# Patient Record
Sex: Female | Born: 2000 | Race: White | Hispanic: No | Marital: Single | Smoking: Never smoker
Health system: Southern US, Community
[De-identification: ages and names within clinical notes are randomized; demographics above are authoritative.]

## PROBLEM LIST (undated history)

## (undated) HISTORY — PX: TONSILLECTOMY: SUR1361

---
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
## 2018-03-30 NOTE — Progress Notes (Deleted)
      HPI: FU dizziness. At time of previous evaluation symptoms felt to be orthostatic mediated. Echocardiogram July 2019 showed normal LV function.  Since last seen  Current Outpatient Medications  Medication Sig Dispense Refill  . Multiple Vitamin (MULTIVITAMIN) capsule Take 1 capsule by mouth daily.     No current facility-administered medications for this visit.      No past medical history on file.  Past Surgical History:  Procedure Laterality Date  . TONSILLECTOMY      Social History   Socioeconomic History  . Marital status: Single    Spouse name: Not on file  . Number of children: Not on file  . Years of education: Not on file  . Highest education level: Not on file  Occupational History  . Not on file  Social Needs  . Financial resource strain: Not on file  . Food insecurity:    Worry: Not on file    Inability: Not on file  . Transportation needs:    Medical: Not on file    Non-medical: Not on file  Tobacco Use  . Smoking status: Never Smoker  . Smokeless tobacco: Never Used  Substance and Sexual Activity  . Alcohol use: Never    Frequency: Never  . Drug use: Never  . Sexual activity: Not on file  Lifestyle  . Physical activity:    Days per week: Not on file    Minutes per session: Not on file  . Stress: Not on file  Relationships  . Social connections:    Talks on phone: Not on file    Gets together: Not on file    Attends religious service: Not on file    Active member of club or organization: Not on file    Attends meetings of clubs or organizations: Not on file    Relationship status: Not on file  . Intimate partner violence:    Fear of current or ex partner: Not on file    Emotionally abused: Not on file    Physically abused: Not on file    Forced sexual activity: Not on file  Other Topics Concern  . Not on file  Social History Narrative  . Not on file    Family History  Problem Relation Age of Onset  . Migraines Mother      ROS: no fevers or chills, productive cough, hemoptysis, dysphasia, odynophagia, melena, hematochezia, dysuria, hematuria, rash, seizure activity, orthopnea, PND, pedal edema, claudication. Remaining systems are negative.  Physical Exam: Well-developed well-nourished in no acute distress.  Skin is warm and dry.  HEENT is normal.  Neck is supple.  Chest is clear to auscultation with normal expansion.  Cardiovascular exam is regular rate and rhythm.  Abdominal exam nontender or distended. No masses palpated. Extremities show no edema. neuro grossly intact  ECG- personally reviewed  A/P  1  Marissa MillersBrian Crenshaw, MD

## 2018-04-05 ENCOUNTER — Other Ambulatory Visit: Payer: Self-pay

## 2018-04-05 ENCOUNTER — Ambulatory Visit (INDEPENDENT_AMBULATORY_CARE_PROVIDER_SITE_OTHER): Payer: BC Managed Care – PPO | Admitting: Pediatrics

## 2018-04-05 VITALS — BP 102/69 | HR 57 | Ht 68.23 in | Wt 130.4 lb

## 2018-04-05 DIAGNOSIS — L659 Nonscarring hair loss, unspecified: Secondary | ICD-10-CM | POA: Diagnosis not present

## 2018-04-05 DIAGNOSIS — Z1389 Encounter for screening for other disorder: Secondary | ICD-10-CM

## 2018-04-05 DIAGNOSIS — Z3202 Encounter for pregnancy test, result negative: Secondary | ICD-10-CM

## 2018-04-05 DIAGNOSIS — N911 Secondary amenorrhea: Secondary | ICD-10-CM | POA: Diagnosis not present

## 2018-04-05 DIAGNOSIS — F5089 Other specified eating disorder: Secondary | ICD-10-CM | POA: Diagnosis not present

## 2018-04-05 DIAGNOSIS — K5901 Slow transit constipation: Secondary | ICD-10-CM

## 2018-04-05 LAB — POCT URINALYSIS DIPSTICK
Bilirubin, UA: NEGATIVE
Blood, UA: NEGATIVE
GLUCOSE UA: NEGATIVE
KETONES UA: NEGATIVE
Leukocytes, UA: NEGATIVE
Nitrite, UA: NEGATIVE
PROTEIN UA: NEGATIVE
Spec Grav, UA: <=1.005 — AB (ref 1.010–1.025)
Urobilinogen, UA: NEGATIVE E.U./dL — AB
pH, UA: 7 (ref 5.0–8.0)

## 2018-04-05 LAB — POCT URINE PREGNANCY: Preg Test, Ur: NEGATIVE

## 2018-04-05 MED ORDER — POLYETHYLENE GLYCOL 3350 17 GM/SCOOP PO POWD: 17.0000 g | Freq: Every day | ORAL | 6 refills | Status: AC

## 2018-04-05 NOTE — Progress Notes (Signed)
THIS RECORD MAY CONTAIN CONFIDENTIAL INFORMATION THAT SHOULD NOT BE RELEASED WITHOUT REVIEW OF THE SERVICE PROVIDER.  Adolescent Medicine Consultation Follow-Up Visit CHRISTY EHRSAM  is a 17 y.o. female referred by Blair Heys, MD here today for follow-up regarding disordered eating.   Last seen in [facility] on 11/16/2017 for disordered eating. Plan at last visit included: recommended resources at [facility]. Pt preferred to not start meds, but to continue nutrition and counseling.   Pertinent Labs? Yes Growth Chart Viewed? yes   History was provided by the patient and mother.  Interpreter? no  PCP Confirmed?  yes  My Chart Activated?   yes   Chief Complaint  Patient presents with  . Follow-up    DE w/o EVS    HPI:   Saw a nutritionist 5 times, thought that was helpful and that for awhile she was feeling better about food. Then at some point "she fell off of that good feeling" and started thinking more about food again. Felt like the quantity of food that she was eating was too much for her stomach; eating based on what the nutritionist recommended. Stools were more regular then, now harder.  Doesn't think her current thoughts surrounding food have changed much since last visit. Is trying to follow what the nurittionist says, but constantly thinks about food. Thinks about whether she should or shouldn't eat something and had frequent guilt over "bad" choices. Thoughts are worse at night when she has more time to think about what she has eaten during the day. Also scared because she was told to drink shakes specifically for weight gain. Wants to try eating disorder specific therapy before medications. Feels like she would be more stressed about medications if taking.   Mom also thinks pt still worries about food a lot. Will call and tell mom about treats that she had during the day and feeling guilty. Told mom about feeling bloated occasionally and mom wants her  stomach to feel better.  Had new abdominal pain in November, surrounding period of increased stress. Felt like abdominal pain continued on and off for the rest of the semester. 4x/week, starts around dinner and lasts until she goes to bed. Feels "knot-like" pain in central abdomen. Makes her not want to eat for fear of pain getting worse. Isn't sure if pain actually increases with food. +heartburn, occasional nausea. Tums and gas-x tried a few times, but didn't work.  Hard stools once/day. No vomiting or blood in stools. No laxatives tried. No known association of certain foods to abdominal pain.  Went to counselor at [facility] 5-6times, was kind of helpful, but not specific to eating disorders. Plans to get someone new who is more focused on disordered eating when she returns to school for the sprin semester on 06JAN.  Stressful semester academically, but did well. 17hrs classes (physics, calc, honors, Public relations account executive). Not depressed, not overly anxious (just worries about food). Mom thinks pt is doing really well and is happy overall at college. Will call mom frequently. Is close to her roommate too.   LMP-no periods since June.  No LMP recorded. (Menstrual status: Irregular Periods). Allergies  Allergen Reactions  . Penicillins    Outpatient Medications Prior to Visit  Medication Sig Dispense Refill  . Multiple Vitamin (MULTIVITAMIN) capsule Take 1 capsule by mouth daily.     No facility-administered medications prior to visit.      Patient Active Problem List   Diagnosis Date Noted  . Disordered eating  10/19/2017  . Dizziness 10/19/2017  . Other fatigue 09/06/2017  . Impingement syndrome of right ankle 02/28/2013    Social History: Changes with school since last visit?  Yes- completed first semester at [employer]  Activities:  Special interests/hobbies/sports: Corporate investment banker at school, workout classes 2x/week, no dancing (plans to dance next semester)  Lifestyle habits that can  impact QOL: Sleep:7-8hrs Eating habits/patterns: 3 meals Breakfast- oatmeal or yogurt, fruit; sometimes breakfast essentials shake Lunch- wrap Dinner-soups, veggies/fish Snack - trail mix, fruit, granola bar Decreased appetite for dinner in the last month.  Water intake: 32oz x 3day Screen time: uses computer all day Sexually active - no, interested in males  Review of Systems  Constitutional: Negative for chills, fever, malaise/fatigue and weight loss.  HENT: Negative for sore throat.   Eyes: Negative for blurred vision, double vision and pain.  Respiratory: Negative for cough, shortness of breath and wheezing.   Cardiovascular: Negative for chest pain and palpitations.  Gastrointestinal: Positive for abdominal pain, constipation, heartburn and nausea. Negative for blood in stool, diarrhea and vomiting.  Genitourinary: Negative for dysuria, frequency and urgency.       No vaginal rashes, lesions, or discharge.  Musculoskeletal: Negative for joint pain and myalgias.  Skin: Negative for rash.       Hair has been thinner over the last 2 months  Neurological: Positive for dizziness (rare, relieved if she eats something) and headaches (rare, resolved with eating something). Negative for loss of consciousness.  Psychiatric/Behavioral: Negative for depression, substance abuse and suicidal ideas. The patient is not nervous/anxious.   All other systems reviewed and are negative.   Confidentiality was discussed with the patient and if applicable, with caregiver as well.  Changes at home or school since last visit:  Living in college dorm, home for the holidays  Physical Exam:  Vitals:   04/05/18 1353  BP: 102/69  Pulse: (!) 57  Weight: 130 lb 6.4 oz (59.1 kg)  Height: 5' 8.23" (1.733 m)   BP 102/69   Pulse (!) 57   Ht 5' 8.23" (1.733 m)   Wt 130 lb 6.4 oz (59.1 kg)   BMI 19.69 kg/m  Body mass index: body mass index is 19.69 kg/m. Blood pressure percentiles are not available  for patients who are 18 years or older.  Physical Exam Vitals signs reviewed.  Constitutional:      General: She is not in acute distress.    Appearance: She is well-developed. She is not diaphoretic.     Comments: Pleasant, well appearing  HENT:     Head: Normocephalic.     Right Ear: External ear normal.     Left Ear: External ear normal.     Nose: Congestion present.     Mouth/Throat:     Pharynx: No oropharyngeal exudate.  Eyes:     General:        Right eye: No discharge.        Left eye: No discharge.     Conjunctiva/sclera: Conjunctivae normal.     Pupils: Pupils are equal, round, and reactive to light.  Neck:     Musculoskeletal: Normal range of motion.     Thyroid: No thyromegaly.  Cardiovascular:     Rate and Rhythm: Normal rate and regular rhythm.     Heart sounds: Normal heart sounds. No murmur. No friction rub. No gallop.      Comments: HR60-65 Pulmonary:     Effort: Pulmonary effort is normal. No respiratory distress.  Breath sounds: Normal breath sounds. No wheezing or rales.  Abdominal:     General: Bowel sounds are normal. There is no distension.     Palpations: Abdomen is soft.     Tenderness: There is no abdominal tenderness. There is no guarding or rebound.  Musculoskeletal: Normal range of motion.        General: No swelling or tenderness.  Lymphadenopathy:     Cervical: No cervical adenopathy.  Skin:    General: Skin is warm.     Capillary Refill: Capillary refill takes less than 2 seconds.     Findings: No erythema or rash.     Comments: Normal scalp and hair. No excessive thinning or alopecia.  Neurological:     Mental Status: She is alert.     Motor: No weakness or abnormal muscle tone.     Coordination: Coordination normal.     Deep Tendon Reflexes: Reflexes are normal and symmetric. Reflexes normal.  Psychiatric:        Mood and Affect: Mood normal.     Assessment/Plan: Leotis ShamesLauren is a [age]yr old gender assigned at birth female who  identifies as female with hx of disordered eating who is here for follow-up after completing her first semester at college. Weight is stable from last visit at 130lbs (128lbs in 10/2017). However, she continues to have frequent repetitive thoughts and worries surrounding food choices and consumption. Also with new intermittent abdominal pain x 2 months, which is most likely related to reported heartburn and constipation, in association with increased stress of school. Bradycardic on initial vitals at 57bpm, but 60-65 on exam. No orthostatic symptoms, though does still have occasional dizziness or headaches if she hasn't eaten in a few hours. Patient thinks that more counseling would be most beneficial to her improvement.  1. Other disorder of eating -recommended follow up as planned [facility] -recommended continued follow up with nutritionist -estradiol recheck (low at 39 on 7/2), if low again, will need bone scan -discussed with mom and pt that if bone scan is abnormal, then she may not be able to do dance next semester due to risk of fracture  2. Secondary amenorrhea -would expect to return as nutrition improves - Estradiol -bone scan if estradiol is low again  3. Slow transit constipation -given handout on tips to restoring function -recommended keeping diary of abdominal pain and triggers. Declines prescription for heartburn med, but could try OTC med if she wants. - polyethylene glycol powder (GLYCOLAX/MIRALAX) powder; Take 17 g by mouth daily.  Dispense: 578 g; Refill: 6  4. Hair loss -likely hair loss with poor nutrition and stress - TSH + free T4  5. Screening for genitourinary condition - POCT urinalysis dipstick  6. Pregnancy examination or test, negative result - POCT urine pregnancy   BH screenings: PHQ-SADS reviewed and indicated: PHQ-15: 5, GAD-7: 4, PHQ-9: 4. Screens discussed with patient and parent and adjustments to plan made  accordingly.   Follow-up:  In 2 months (mom to call once pt knows her school schedule)   Annell GreeningPaige Aycen Porreca, MD, MS [facility] PGY3

## 2018-04-05 NOTE — Patient Instructions (Signed)
Thanks for visiting adolescent clinic! We are so glad college is going well!  -Please follow up with the disordered eating counselor recommended -Continue to follow recommended meal plan by nutritionist -Take miralax one cap daily for 2-443months before stopping, okay to decrease to 1/2cap if stools are too loose -We will call you if the labs are abnormal and need further workup  Follow up in 2 months.  Call us with any concerns!!

## 2018-04-06 LAB — ESTRADIOL: Estradiol: 36 pg/mL

## 2018-04-06 LAB — TSH+FREE T4: TSH W/REFLEX TO FT4: 1.57 mIU/L

## 2018-04-08 ENCOUNTER — Ambulatory Visit: Payer: BC Managed Care – PPO | Admitting: Cardiology

## 2019-04-25 ENCOUNTER — Ambulatory Visit: Payer: BC Managed Care – PPO

## 2019-05-29 ENCOUNTER — Telehealth: Payer: Self-pay | Admitting: Family Medicine

## 2019-05-29 NOTE — Telephone Encounter (Signed)
Abia called for an appt and I could not find anything until after April. [phone] please call back if any appts that I missed. She would like an appt with Dr. Marina Goodell.

## 2019-05-30 NOTE — Telephone Encounter (Signed)
Sent to pod scheduler.

## 2019-06-01 NOTE — Telephone Encounter (Signed)
Called number on file, no answer, left VM to call office back. ° °

## 2019-07-13 ENCOUNTER — Ambulatory Visit (INDEPENDENT_AMBULATORY_CARE_PROVIDER_SITE_OTHER): Payer: BC Managed Care – PPO | Admitting: Family

## 2019-07-13 ENCOUNTER — Other Ambulatory Visit: Payer: Self-pay

## 2019-07-13 ENCOUNTER — Encounter: Payer: Self-pay | Admitting: Family

## 2019-07-13 VITALS — BP 114/72 | HR 93 | Ht 68.5 in | Wt 135.0 lb

## 2019-07-13 DIAGNOSIS — F509 Eating disorder, unspecified: Secondary | ICD-10-CM | POA: Diagnosis not present

## 2019-07-13 NOTE — Progress Notes (Signed)
Supervising Provider Co-Signature  I reviewed with the resident the medical history and the resident's findings on physical examination.  I discussed with the resident the patient's diagnosis and concur with the treatment plan as documented in the resident's note.  Matilde Markie M Deneane Stifter, NP 

## 2019-07-13 NOTE — Progress Notes (Signed)
THIS RECORD MAY CONTAIN CONFIDENTIAL INFORMATION THAT SHOULD NOT BE RELEASED WITHOUT REVIEW OF THE SERVICE PROVIDER.  Adolescent Medicine Consultation Follow-Up Visit Marissa Graham  is a 19 y.o. female referred by Gaynelle Arabian, MD here today for follow-up regarding  Eating disorder.    Pertinent Labs? No Growth Chart Viewed? No   History was provided by the patient.  Interpreter? No No chief complaint on file.   HPI:   PCP Confirmed?  Yes Dr. Marisue Humble  Nutritionist: Lynden Ang, in [place] with [facility], last seen 2 weeks ago.   Therapist- Cassell Smiles in [place] sees every other week  Patient's personal or confidential phone number:  [phone]  LMP: 3/4 - 3/7. Has been having regular periods for last 5 months. Mild dysmenorrhea.   Allergies  Allergen Reactions  . Penicillins    Current Outpatient Medications on File Prior to Visit  Medication Sig Dispense Refill  . Multiple Vitamin (MULTIVITAMIN) capsule Take 1 capsule by mouth daily.    . sertraline (ZOLOFT) 100 MG tablet Take 100 mg by mouth daily.    . polyethylene glycol powder (GLYCOLAX/MIRALAX) powder Take 17 g by mouth daily. (Patient not taking: Reported on 07/13/2019) 578 g 6   No current facility-administered medications on file prior to visit.    Patient Active Problem List   Diagnosis Date Noted  . Secondary amenorrhea 04/05/2018  . Disordered eating 10/19/2017  . Dizziness 10/19/2017  . Other fatigue 09/06/2017  . Impingement syndrome of right ankle 02/28/2013    Social History: Changes with school since last visit?  [place], home this semester but virtual. Working as Surveyor, minerals 2 times weekly. Studying Agroecology. Lives with parents and younger sister. Stable home environment.  Activities:  Special interests/hobbies/sports: focused on school   Lifestyle habits that can impact QOL: Sleep: tossing and turning, restless, wakes up rested, 7-8 hours of sleep per night  Eating  habits/patterns: Restricting: 3 meals daily, 3 snacks most days, carbs fat and protein at meal, no carnation since 03-14-2023.  After grandfather's death in 05/13/2022 wasn't eating as big meals, not "honoring hunger". Eating has improved some.   Binging: none Vomiting: none Laxatives: none Diuretics: none Diet Pills: none Activity: Walks and yoga, does incorporate dance every now and then about 1-2 times per week. Walks 40 minutes, Yoga 10-40 minutes. Does both daily most days.  Diets: none Food manipulation: none  Water intake: 2 - 32 oz daily  Confidentiality was discussed with the patient and if applicable, with caregiver as well.  Changes at home or school since last visit:  No  Gender identity: female  Sex assigned at birth: female Pronouns:  she, her hers  Tobacco? No  Drugs/ETOH?  No  Partner preference?  Men Sexually Active? No  Pregnancy Prevention:  none Reviewed condoms:  Yes   Suicidal or homicidal thoughts? no  Self injurious behaviors? no Guns in the home? no   Physical Exam:  Vitals:   07/13/19 0838  BP: 114/72  Pulse: 93  Weight: 135 lb (61.2 kg)  Height: 5' 8.5" (1.74 m)   BP 114/72   Pulse 93   Ht 5' 8.5" (1.74 m)   Wt 135 lb (61.2 kg)   BMI 20.23 kg/m  Body mass index: body mass index is 20.23 kg/m. Growth percentile SmartLinks can only be used for patients less than 76 years old.  Wt Readings from Last 3 Encounters:  07/13/19 135 lb (61.2 kg)  04/05/18 130 lb 6.4 oz (59.1 kg) (57 %,  Z= 0.19)*  11/16/17 128 lb (58.1 kg) (55 %, Z= 0.12)*   * Growth percentiles are based on CDC (Girls, 2-20 Years) data.   Physical Exam  General: well appearing, appears stated age and well nourished HEENT: normocephalic, atraumatic. Eyes: PERRL, EOMI, Ears normal TMs, patent nares, oropharynx without ulcers or lesions Heart: S1/s2 heard, RRR, no M/R/G Chest: CTAB Abdomen: soft, flat, non tender, + BS MSK: moving spontaneously Neuro: no focal deficits, reflexes  intact Skin: warm, well perfused, no lesions or bruises  Assessment/Plan: Patient is 19 yo F, history of eating disorder here today for follow up. Overall patient is stable with improved eating behaviors, mood, and consistent weight gain. She is engaged with nutritionist and therapist routinely. No labs today and she is well appearing and well nourished on exam. Recent history significant for loss of grandfather in January which patient is still grieving but exhibiting good behaviors. Will follow up in 3 mos since patient will still be in [place].  BH screenings: PHQ-SADS reviewed and indicated PHQ-15: 5, GAD-7: 3, PHQ-9: 5. Screens discussed with patient and parent and adjustments to plan made accordingly.   Follow-up:  Return for Follow up in 3 months in person.   Medical decision-making:  >10 minutes spent face to face with patient with more than 50% of appointment spent discussing diagnosis, management, follow-up, and reviewing of medications, eating patterns, and mood.

## 2019-09-28 ENCOUNTER — Ambulatory Visit: Payer: Self-pay | Admitting: Family

## 2019-10-02 ENCOUNTER — Ambulatory Visit: Payer: Self-pay | Admitting: Family
